# Patient Record
Sex: Female | Born: 1950 | Race: White | Hispanic: No | Marital: Married
Health system: Southern US, Community
[De-identification: ages and names within clinical notes are randomized; demographics above are authoritative.]

---

## 1997-10-21 ENCOUNTER — Other Ambulatory Visit: Admission: RE | Admit: 1997-10-21 | Discharge: 1997-10-21 | Payer: Self-pay | Admitting: Obstetrics and Gynecology

## 1997-10-30 ENCOUNTER — Ambulatory Visit (HOSPITAL_COMMUNITY): Admission: RE | Admit: 1997-10-30 | Discharge: 1997-10-30 | Payer: Self-pay | Admitting: Obstetrics and Gynecology

## 1998-11-05 ENCOUNTER — Encounter: Payer: Self-pay | Admitting: Obstetrics and Gynecology

## 1998-11-05 ENCOUNTER — Ambulatory Visit (HOSPITAL_COMMUNITY): Admission: RE | Admit: 1998-11-05 | Discharge: 1998-11-05 | Payer: Self-pay | Admitting: Obstetrics and Gynecology

## 1998-11-13 ENCOUNTER — Other Ambulatory Visit: Admission: RE | Admit: 1998-11-13 | Discharge: 1998-11-13 | Payer: Self-pay | Admitting: Obstetrics and Gynecology

## 1999-01-04 ENCOUNTER — Other Ambulatory Visit: Admission: RE | Admit: 1999-01-04 | Discharge: 1999-01-04 | Payer: Self-pay | Admitting: Obstetrics and Gynecology

## 1999-01-04 ENCOUNTER — Encounter (INDEPENDENT_AMBULATORY_CARE_PROVIDER_SITE_OTHER): Payer: Self-pay | Admitting: Specialist

## 1999-12-01 ENCOUNTER — Ambulatory Visit (HOSPITAL_COMMUNITY): Admission: RE | Admit: 1999-12-01 | Discharge: 1999-12-01 | Payer: Self-pay | Admitting: Obstetrics and Gynecology

## 1999-12-01 ENCOUNTER — Encounter: Payer: Self-pay | Admitting: Obstetrics and Gynecology

## 2000-02-24 ENCOUNTER — Other Ambulatory Visit: Admission: RE | Admit: 2000-02-24 | Discharge: 2000-02-24 | Payer: Self-pay | Admitting: Obstetrics and Gynecology

## 2000-10-26 ENCOUNTER — Encounter: Payer: Self-pay | Admitting: Obstetrics and Gynecology

## 2000-10-31 ENCOUNTER — Encounter (INDEPENDENT_AMBULATORY_CARE_PROVIDER_SITE_OTHER): Payer: Self-pay | Admitting: *Deleted

## 2000-10-31 ENCOUNTER — Inpatient Hospital Stay (HOSPITAL_COMMUNITY): Admission: RE | Admit: 2000-10-31 | Discharge: 2000-11-01 | Payer: Self-pay | Admitting: Obstetrics and Gynecology

## 2000-12-05 ENCOUNTER — Encounter: Payer: Self-pay | Admitting: Obstetrics and Gynecology

## 2000-12-05 ENCOUNTER — Ambulatory Visit (HOSPITAL_COMMUNITY): Admission: RE | Admit: 2000-12-05 | Discharge: 2000-12-05 | Payer: Self-pay | Admitting: Obstetrics and Gynecology

## 2001-12-06 ENCOUNTER — Encounter: Payer: Self-pay | Admitting: Family Medicine

## 2001-12-06 ENCOUNTER — Ambulatory Visit (HOSPITAL_COMMUNITY): Admission: RE | Admit: 2001-12-06 | Discharge: 2001-12-06 | Payer: Self-pay | Admitting: Obstetrics and Gynecology

## 2002-12-10 ENCOUNTER — Ambulatory Visit (HOSPITAL_COMMUNITY): Admission: RE | Admit: 2002-12-10 | Discharge: 2002-12-10 | Payer: Self-pay | Admitting: Family Medicine

## 2003-12-11 ENCOUNTER — Ambulatory Visit (HOSPITAL_COMMUNITY): Admission: RE | Admit: 2003-12-11 | Discharge: 2003-12-11 | Payer: Self-pay | Admitting: Family Medicine

## 2003-12-26 ENCOUNTER — Ambulatory Visit (HOSPITAL_COMMUNITY): Admission: RE | Admit: 2003-12-26 | Discharge: 2003-12-26 | Payer: Self-pay

## 2004-12-17 ENCOUNTER — Ambulatory Visit (HOSPITAL_COMMUNITY): Admission: RE | Admit: 2004-12-17 | Discharge: 2004-12-17 | Payer: Self-pay | Admitting: Family Medicine

## 2006-01-20 ENCOUNTER — Ambulatory Visit (HOSPITAL_COMMUNITY): Admission: RE | Admit: 2006-01-20 | Discharge: 2006-01-20 | Payer: Self-pay | Admitting: Family Medicine

## 2007-01-22 ENCOUNTER — Ambulatory Visit (HOSPITAL_COMMUNITY): Admission: RE | Admit: 2007-01-22 | Discharge: 2007-01-22 | Payer: Self-pay | Admitting: Family Medicine

## 2007-02-21 ENCOUNTER — Emergency Department (HOSPITAL_COMMUNITY): Admission: EM | Admit: 2007-02-21 | Discharge: 2007-02-21 | Payer: Self-pay | Admitting: Emergency Medicine

## 2007-03-18 ENCOUNTER — Encounter: Admission: RE | Admit: 2007-03-18 | Discharge: 2007-03-18 | Payer: Self-pay | Admitting: Neurology

## 2007-05-17 ENCOUNTER — Ambulatory Visit (HOSPITAL_COMMUNITY): Admission: RE | Admit: 2007-05-17 | Discharge: 2007-05-17 | Payer: Self-pay | Admitting: Cardiology

## 2007-11-27 ENCOUNTER — Emergency Department (HOSPITAL_COMMUNITY): Admission: EM | Admit: 2007-11-27 | Discharge: 2007-11-27 | Payer: Self-pay | Admitting: Emergency Medicine

## 2008-02-27 ENCOUNTER — Ambulatory Visit (HOSPITAL_COMMUNITY): Admission: RE | Admit: 2008-02-27 | Discharge: 2008-02-27 | Payer: Self-pay | Admitting: Family Medicine

## 2008-05-09 ENCOUNTER — Ambulatory Visit (HOSPITAL_COMMUNITY): Admission: RE | Admit: 2008-05-09 | Discharge: 2008-05-09 | Payer: Self-pay | Admitting: Family Medicine

## 2009-03-03 ENCOUNTER — Ambulatory Visit (HOSPITAL_COMMUNITY): Admission: RE | Admit: 2009-03-03 | Discharge: 2009-03-03 | Payer: Self-pay | Admitting: Family Medicine

## 2009-03-06 ENCOUNTER — Encounter: Admission: RE | Admit: 2009-03-06 | Discharge: 2009-03-06 | Payer: Self-pay | Admitting: Family Medicine

## 2009-04-21 ENCOUNTER — Emergency Department (HOSPITAL_COMMUNITY): Admission: EM | Admit: 2009-04-21 | Discharge: 2009-04-21 | Payer: Self-pay | Admitting: Emergency Medicine

## 2009-08-11 ENCOUNTER — Encounter: Admission: RE | Admit: 2009-08-11 | Discharge: 2009-08-11 | Payer: Self-pay | Admitting: Family Medicine

## 2010-02-28 ENCOUNTER — Encounter: Payer: Self-pay | Admitting: Neurology

## 2010-03-09 ENCOUNTER — Encounter
Admission: RE | Admit: 2010-03-09 | Discharge: 2010-03-09 | Payer: Self-pay | Source: Home / Self Care | Attending: Family Medicine | Admitting: Family Medicine

## 2010-05-02 LAB — CBC
HCT: 44.9 % (ref 36.0–46.0)
Hemoglobin: 15.3 g/dL — ABNORMAL HIGH (ref 12.0–15.0)
MCHC: 34.1 g/dL (ref 30.0–36.0)
MCV: 93.1 fL (ref 78.0–100.0)
Platelets: 154 10*3/uL (ref 150–400)
RBC: 4.82 MIL/uL (ref 3.87–5.11)
RDW: 13.3 % (ref 11.5–15.5)
WBC: 8.4 10*3/uL (ref 4.0–10.5)

## 2010-05-02 LAB — CK TOTAL AND CKMB (NOT AT ARMC)
CK, MB: 1.6 ng/mL (ref 0.3–4.0)
Relative Index: INVALID (ref 0.0–2.5)
Total CK: 85 U/L (ref 7–177)

## 2010-05-02 LAB — DIFFERENTIAL
Basophils Absolute: 0 10*3/uL (ref 0.0–0.1)
Basophils Relative: 0 % (ref 0–1)
Eosinophils Absolute: 0 10*3/uL (ref 0.0–0.7)
Eosinophils Relative: 0 % (ref 0–5)
Lymphocytes Relative: 14 % (ref 12–46)
Lymphs Abs: 1.2 10*3/uL (ref 0.7–4.0)
Monocytes Absolute: 0.2 10*3/uL (ref 0.1–1.0)
Monocytes Relative: 2 % — ABNORMAL LOW (ref 3–12)
Neutro Abs: 7 10*3/uL (ref 1.7–7.7)
Neutrophils Relative %: 83 % — ABNORMAL HIGH (ref 43–77)

## 2010-05-02 LAB — COMPREHENSIVE METABOLIC PANEL
ALT: 13 U/L (ref 0–35)
AST: 12 U/L (ref 0–37)
Albumin: 4.2 g/dL (ref 3.5–5.2)
Alkaline Phosphatase: 60 U/L (ref 39–117)
BUN: 10 mg/dL (ref 6–23)
CO2: 27 mEq/L (ref 19–32)
Calcium: 9.5 mg/dL (ref 8.4–10.5)
Chloride: 107 mEq/L (ref 96–112)
Creatinine, Ser: 0.73 mg/dL (ref 0.4–1.2)
GFR calc Af Amer: >60 mL/min (ref >60)
GFR calc non Af Amer: >60 mL/min (ref >60)
Glucose, Bld: 101 mg/dL — ABNORMAL HIGH (ref 70–99)
Potassium: 3.9 mEq/L (ref 3.5–5.1)
Sodium: 142 mEq/L (ref 135–145)
Total Bilirubin: 0.5 mg/dL (ref 0.3–1.2)
Total Protein: 6.9 g/dL (ref 6.0–8.3)

## 2010-05-02 LAB — URINALYSIS, ROUTINE W REFLEX MICROSCOPIC
Bilirubin Urine: NEGATIVE
Glucose, UA: NEGATIVE mg/dL
Hgb urine dipstick: NEGATIVE
Ketones, ur: NEGATIVE mg/dL
Nitrite: NEGATIVE
Protein, ur: NEGATIVE mg/dL
Specific Gravity, Urine: 1.004 — ABNORMAL LOW (ref 1.005–1.030)
Urobilinogen, UA: 0.2 mg/dL (ref 0.0–1.0)
pH: 7 (ref 5.0–8.0)

## 2010-05-02 LAB — TROPONIN I: Troponin I: <0.01 ng/mL (ref 0.00–0.06)

## 2010-05-02 LAB — SEDIMENTATION RATE: Sed Rate: 1 mm/hr (ref 0–22)

## 2010-06-22 NOTE — Op Note (Signed)
NAME:  Katelyn Martin, Katelyn Martin                  ACCOUNT NO.:  [id number]   MEDICAL RECORD NO.:  [id number]          PATIENT TYPE:  AMB   LOCATION:  ENDO                         FACILITY:  [facility]   PHYSICIAN:  Darlin Priestly, MD  DATE OF BIRTH:  Jul 20, 1950   DATE OF PROCEDURE:  DATE OF DISCHARGE:                               OPERATIVE REPORT   PROCEDURE:  Transesophageal echocardiogram.   COMPLICATIONS:  None.   INDICATIONS:  Ms. Virgo is a 60 year old female, the patient of Dr.  Avie Echevaria, and Dr. Oley Balm with a positive family history of  CVA, who developed a right body numbness on 03/15/2007, which lasted  approximately 15 minutes.  She subsequently had a head CT without  contrast, which was normal and was referred for neurologic evaluation.  She did have an MRI, which was suggestive of small vessel ischemia with  a normal MRA, carotid Dopplers, and ECG.  She is now referred for TE to  rule out possible cardiac source of illness.   DESCRIPTION OF PROCEDURE:  After informed consent, the patient was  brought to the endoscopy suite in a fasting state.  She underwent a  successful and uncomplicated transesophageal echocardiogram.   The left ventricle was normal in size and function with an estimated EF  of 60%.   The aortic valve was structurally normal with no significant aortic  stenosis or regurgitation.  The mitral valve leaflet was structurally  normal with trivial mitral regurg.   Structurally, normal tricuspid valve with trivial tricuspid  regurgitation.   No evidence of PFO by color or contrast Doppler.  No evidence of  endocardiac mass or thrombus.   Normal descending thoracic aorta.   CONCLUSIONS:  Essentially normal transesophageal echocardiogram with no  evidence of endocardiac mass, thrombus, or PFO.      Darlin Priestly, MD  Electronically Signed     RHM/MEDQ  D:  05/17/2007  T:  05/18/2007  Job:  [id number]   cc:   Genene Churn. Love, M.D.  D. Oley Balm III, M.D.

## 2010-06-22 NOTE — H&P (Signed)
NAME:  JOSI, ROEDIGER NO.:  [id number]   MEDICAL RECORD NO.:  [id number]          PATIENT TYPE:  AMB   LOCATION:  ENDO                         FACILITY:  [facility]   PHYSICIAN:  Darlin Priestly, MD  DATE OF BIRTH:  May 29, 1950   DATE OF ADMISSION:  05/17/2007  DATE OF DISCHARGE:                              HISTORY & PHYSICAL   HISTORY OF PRESENT ILLNESS:  Ms. Scaff is a 60 year old female who was  referred to Dr. Jenne Campus for TEE by Dr. Deanne Coffer at [facility].  She had been seen initially by Dr. Sandria Manly on March 15, 2007 after  transient right facial numbness. She had a negative workup so far.  She  is seen now for elective TEE.   PAST MEDICAL HISTORY:  Unremarkable for coronary artery disease, or  hypertension or diabetes.   CURRENT MEDICATIONS:  1. Premarin 0.5 mg a day.  2. Lexapro 10 mg 1/2 tablet b.i.d.  3. Aspirin 81 mg a day.   ALLERGIES:  She has not had any drug allergies.   SOCIAL HISTORY:  She is a Retail banker and works full times.  She is  married.  She smokes a couple cigarettes a day.   FAMILY HISTORY:  Unremarkable.   REVIEW OF SYSTEMS:  Essentially unremarkable except for above.  She has  not had tachycardia, palpitations, or syncope.   PHYSICAL EXAMINATION:  VITAL SIGNS:  Blood pressure 136/72, pulse 55,  respirations 12.  GENERAL:  She is a well-developed, well-nourished thin female in no  acute distress.  HEENT:  Normocephalic, atraumatic.  Extraocular ocular movements are  intact.  Sclerae is anicteric.  NECK:  Without JVD or bruit.  CHEST:  Clear to auscultation percussion.  CARDIAC:  Regular rate and rhythm without murmur, rub or gallop.  ABDOMEN:  Nontender.  No hepatosplenomegaly.  EXTREMITIES:  Without edema.  Distal pulses are intact.  NEUROLOGICAL:  Grossly intact.  She is awake, alert and oriented,  operative.  Moves all extremities without obvious deficit.   IMPRESSION:  1. Transient ischemic attack.  2. History of  smoking.   PLAN:  The patient is admitted for elective TEE by Dr. Jenne Campus.  Should  note the patient has no history of esophageal stricture or GI disease.      Abelino Derrick, P.A.      Darlin Priestly, MD  Electronically Signed    LKK/MEDQ  D:  05/17/2007  T:  05/18/2007  Job:  [id number]

## 2010-06-25 NOTE — Op Note (Signed)
[facility]  Patient:    Katelyn Martin, Katelyn Martin Visit Number: [id number] MRN: [id number]          Service Type: GYN Location: 1S X004 01 Attending Physician:  Jenean Lindau Dictated by:   Laqueta Linden, M.D. Proc. Date: 10/31/00 Admit Date:  10/31/2000                             Operative Report  PREOPERATIVE DIAGNOSIS:  Leiomyomata uteri.  POSTOPERATIVE DIAGNOSIS:  Leiomyomata uteri plus ovarian endometriosis.  PROCEDURE:  Total abdominal hysterectomy with left salpingo-oophorectomy.  SURGEON:  Laqueta Linden, M.D.  ASSISTANT:  Andres Ege, M.D.  ANESTHESIA:  General endotracheal.  ESTIMATED BLOOD LOSS:  500 cc.  URINE OUTPUT:  450 cc.  FLUIDS:  1300 cc of crystalloid.  COUNTS:  Correct x 2.  COMPLICATIONS:  None.  INDICATIONS:  The patient is a 60 year old gravida 3, para 0 female with a large fibroid uterus which had been noted to be gradually increasing in size over the past several years on ultrasound.  Her main symptoms were menorrhagia and some pressure symptoms, although she was not terribly aware beyond having some urinary frequency.  Alternatives including awaiting menopause with anticipated shrinkage of the fibroids, which was not felt likely to shrink the fibroids to a sufficient degree to avoid surgery, versus definitive surgical management was discussed.  The patient elected to proceed to hysterectomy. She was offered other alternatives including endometrial ablation, embolization, resection, or hormonal management to get her to menopause and reassess her uterus size and symptoms.  She did desire ovarian conservation unless abnormalities of the ovaries were encountered.  She is aware that she may have an increased risks of ovarian cancer due to her nulliparous state, but desires natural rather than surgical menopause if possible.  She has seen the informed consent film as well as her understanding of all risks,  benefits, alternatives, and complications including, but not limited to anesthesia risks, infection, bleeding possibly requiring transfusion, injury to bowel, bladder, ureters, vessels, and nerves, possibility of fistula formation, postoperative expectations regarding recovery, sexual functioning, immediate versus eventual menopause, and the likely need for ______ replacement therapy, a well as other surgical risks including DVT, PE, pneumonia, death, and other unnamed risks.  She has voiced her understanding of all the risks, and agrees to proceed.  Full consent was given.  She has received Ancef 1 g IV antibiotic prophylaxis preoperatively.  DESCRIPTION OF PROCEDURE:  The patient was taken to the operating room and after proper identification and consents were ascertained, she was placed on the operating table in the supine position.  After the induction of general endotracheal anesthesia, she was placed in the frog-leg position, and the abdomen, perineum, and vagina were prepped and draped in the routine sterile fashion.  A transurethral Foley was placed.  The patient was then returned to the supine position, at which time, a low Pfannenstiel incision was made, and carried down to the level of the anterior rectus fascia.  The fascia was incised and the incision was extended laterally, superiorly, and inferiorly. The rectus muscles were separated and the parietal peritoneum elevated and incised.  The incision was extended superiorly and inferiorly to the level of the bladder.  Palpation of the upper abdomen revealed smooth renal contours bilaterally.  No obvious gallstones.  The appendix was noted to be quite long with a sludge and a small stone noted in the tip  which was gently milked into the lumen of the cecum.  The appendix was otherwise free of adhesions or evidence of endometriosis.  The self-retaining retractor was placed and the bowel packed in the upper abdomen using moistened  lap packs.  Inspection of the pelvis revealed an enlarged, grossly distorted uterus with a large fundal and right-sided serosal fibroids _________ mobility.  The right ovary was definitely adherent to the posterior aspect of the uterus and when eventually freed up, had diffuse scarring consistent with endometriosis.  Both tubes appeared normal.  The left ovary was small and menopausal in appearance, but was otherwise within normal limits.  The uterus was elevated in the operative plane.  The round ligaments were clamped, cut, and suture ligated with 0 Vicryl suture, and tagged.  The broad ligament was then opened anteriorly and posteriorly on both sides.  It was elected to remove the left ovary due to the scarring and endometriotic changes as per previous discussion with the patient.  The infundibulopelvic ligament was isolated, clamped, and cut after ascertaining the ureter was well out of the operative field.  It was doubly ligated with 0 Vicryl.  The right adnexa were retained and a curved Heaney clamp was placed across the proximal utero-ovarian ligament and fallopian tube.  This pedicle was doubly ligated and then suspended from the round ligament on that side.  The uterus was then elevated and further dissection of the bladder off the lower uterine segment and cervix was then performed using sharp and blunt dissection.  The uterine vessels were skeletonized and curved Heaney clamps placed perpendicularly across the vessels at the level of the internal os.  Pedicles were cut and suture ligated with 3-0 Vicryl.  A straight Heaney clamp was placed across an additional small pedicle of tissue with the pedicle cut and ligated such that the suture incorporated the uterine vessels such that they were doubly ligated.  Successive straight Heaney clamps were placed across the cardinal ligaments bilaterally to the level of the upper vaginal angle.  Curved Heaney clamps were placed across the  upper vaginal angles with the uterosacral ligament. The upper vagina was entered and the cervix was circumscribed with the  Satinsky scissors and the specimen removed.  The uterus, left tube and ovary were sent in one piece to pathology.  The angles were closed with Richardson angled sutures of 0 Vicryl.  The remainder of the vaginal cuff was closed from front to back using interrupted figure-of-eight sutures of 0 Vicryl.  Copious lavage was accomplished and several small bleeding points were cauterized.  The uterosacral ligaments were plicated posteriorly.  The ureters were normal in appearance and location and peristalsing normally bilaterally.  Hemostasis was noted to be excellent in all pedicles and peritoneal surfaces. All instruments and lap packs were removed.  Needle, sponge, and instrument counts were correct prior to closing the abdomen.  Parietal peritoneum was closed in a running fashion using 2-0 Vicryl suture.  The rectus muscles were loosely reapproximated in the midline.  After subfascial hemostasis was ascertained, the fascia was closed from both lateral aspects to the midline using a running stitch of 0 Maxon. Subcutaneous hemostasis was ascertained.  The skin was closed with staples and Steri-Strips after a pressure dressing was applied.  The patient was extubated stable and transferred to the recovery room. Estimated blood loss was 100 cc.  Urine output was 450 cc.  Fluids 1300 cc of crystalloids.  Counts correct x 2.  Complications none. Dictated by:  Laqueta Linden, M.D. Attending Physician:  Jenean Lindau DD:  10/31/00 TD:  10/31/00 Job: [id number]

## 2010-06-25 NOTE — H&P (Signed)
[facility]  Patient:    Katelyn Martin, Katelyn Martin Visit Number: [id number] MRN: [id number]          Service Type: GYN Location: 1S X004 01 Attending Physician:  Jenean Lindau Dictated by:   Laqueta Linden, M.D. Admit Date:  10/31/2000                           History and Physical  IDENTIFYING DATA:  The patient is a 60 year old female admitted for TAH and possible BSO for symptomatic enlarging fibroids.  HISTORY OF PRESENT ILLNESS:  The patient is a 60 year old gravida 3, para 0, married female who referred herself for another evaluation due to large fibroids and a previous recommendation of hysterectomy.  She was noted to have a 16- to 18-week-size uterus.  Her main complaint was menorrhagia and change in frequency of menses consistent with her age.  She had had multiple followup ultrasounds and an endometrial biopsy performed in the preceding one to two years which had been normal.  At that time, a long discussion was held regarding the anticipated menopausal shrinkage of the fibroids, which were not felt to be significant enough to make them not be a problem.  Alternatives including endometrial ablation, embolization, myomectomy and fibroid drilling were also discussed with her at length.  She elected to proceed to definitive surgical management.  She desired ovarian conservation if they appeared normal but understood that unilateral or bilateral salpingo-oophorectomy would be performed in the event of any abnormality encountered.  She understands that she will be naturally menopausal in the relative near future versus surgically menopausal if her ovaries are removed at the time of surgery.  She presents now for definitive surgical management.  Full consent is given.  PAST HISTORY: Medical:  Negative.  Surgical:  D&C x 3.  Obstetrical:  Nulliparous.  Gynecologic:  History of three first trimester pregnancy terminations in the distant past.   Husband has had a vasectomy.  Last Pap smear in January of 2002 within normal limits.  Mammogram, October of 2001, within normal limits.  ALLERGIES:  No known drug allergies or Latex sensitivities.   CURRENT MEDICATIONS: 1. Womens Only one daily vitamin. 2. Tums two twice daily.  TRANSFUSION HISTORY:  Negative.  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  Her husband is a Occupational hygienist for [employer]. She smokes approximately one-half pack per day.  She denies any alcohol abuse or illicit drug use.  She exercises regularly.  FAMILY HISTORY:  Positive for father with hypertension with a CVA at the age of 2 and lung cancer.  Mother with a stroke due to an AVM with associated seizures.  Brother with multiple CVAs in a vegetative state at the age of 58 with a negative clotting workup and unclear etiology of the CVAs.  Family history otherwise noncontributory.  REVIEW OF SYSTEMS:  Notable for the history of present illness.  She reports some vague pressure symptoms and urinary frequency but no significant back pain, bowel changes or bladder problems.  CARDIOVASCULAR/RESPIRATORY: Negative.  GI:  Negative.  NEUROLOGIC:  Negative.  PHYSICAL EXAMINATION:  GENERAL:  Physical exam performed prior to admission revealed a healthy, thin white female in no apparent distress.  Height 5 feet 6-1/2 inches.  Weight 129 pounds.  VITAL SIGNS:  Blood pressure 124/70.  HEENT:  Grossly negative.  Oropharynx clear.  NECK:  Supple without thyromegaly or lymphadenopathy.  CHEST:  Without spine or CVA tenderness.  LUNGS:  Clear to auscultation.  HEART:  Regular rate and rhythm without murmurs, gallops or rubs.  Carotids +2 and equal without bruit.  Distal pulses full.  BREASTS:  Without masses.  ABDOMEN:  Without hepatosplenomegaly.  Firm suprapubic mass palpated to midway between the symphysis and the umbilicus.  PELVIC:  Normal  female external genitalia, clean vagina and cervix.  Uterus 16 to 18 weeks size and grossly irregular.  Adnexa not palpated. Rectovaginal confirmatory.  EXTREMITIES:  Without edema.  NEUROLOGIC:  Grossly nonfocal.  LABORATORY STUDIES:  Laboratory studies on admission reveal a hemoglobin of 16.9, hematocrit of 48.7, normal WBC and platelet function, normal metabolic profile, normal coagulation studies.  Urinalysis with less than 3 rbcs and rare epithelial cells.  Chest x-ray with mild chronic bronchitis, otherwise normal.  EKG not performed per anesthesia.  IMPRESSIONS ON ADMISSION: 1. Perimenopausal female with large symptomatic fibroids. 2. Smoker. 3. Strong family history of cerebrovascular accidents.  PLAN:  The patient is admitted for same-day surgery.  She will undergo a TAH with BSO only if felt indicated at the time of surgery.  She has been extensively counseled as to the risks, benefits, alternatives and complications and agrees to proceed.  Full consent has been given.  She has been n.p.o. since midnight and received 1 g of IV antibiotics prophylaxis. Dictated by:   Laqueta Linden, M.D. Attending Physician:  Jenean Lindau DD:  10/31/00 TD:  10/31/00 Job: [id number]

## 2010-10-28 LAB — I-STAT 8, (EC8 V) (CONVERTED LAB)
Acid-Base Excess: 2
BUN: 22
Bicarbonate: 29.6 — ABNORMAL HIGH
Chloride: 107
Glucose, Bld: 107 — ABNORMAL HIGH
HCT: 52 — ABNORMAL HIGH
Hemoglobin: 17.7 — ABNORMAL HIGH
Operator id: 270651
Potassium: 4.3
Sodium: 140
TCO2: 31
pCO2, Ven: 56.9 — ABNORMAL HIGH
pH, Ven: 7.325 — ABNORMAL HIGH

## 2010-10-28 LAB — POCT I-STAT CREATININE
Creatinine, Ser: 0.9
Operator id: 270651

## 2010-11-02 LAB — CBC
HCT: 47 — ABNORMAL HIGH
Platelets: 146 — ABNORMAL LOW
RDW: 13.6

## 2010-11-02 LAB — BASIC METABOLIC PANEL
BUN: 15
GFR calc non Af Amer: >60
Glucose, Bld: 86
Potassium: 4.5

## 2010-11-02 LAB — APTT: aPTT: 29

## 2010-11-08 LAB — CBC
HCT: 44.6
Hemoglobin: 14.7
MCHC: 33
RDW: 13.2

## 2010-11-08 LAB — DIFFERENTIAL
Basophils Absolute: 0
Basophils Relative: 1
Eosinophils Absolute: 0.1
Eosinophils Relative: 1
Monocytes Absolute: 0.2
Neutro Abs: 8.1 — ABNORMAL HIGH

## 2010-11-08 LAB — POCT I-STAT, CHEM 8
Calcium, Ion: 1.11 — ABNORMAL LOW
HCT: 46
TCO2: 27

## 2011-03-09 ENCOUNTER — Other Ambulatory Visit (HOSPITAL_COMMUNITY): Payer: Self-pay | Admitting: Family Medicine

## 2011-03-09 DIAGNOSIS — Z1231 Encounter for screening mammogram for malignant neoplasm of breast: Secondary | ICD-10-CM

## 2011-04-13 ENCOUNTER — Ambulatory Visit (HOSPITAL_COMMUNITY): Payer: Self-pay

## 2011-05-23 ENCOUNTER — Ambulatory Visit (HOSPITAL_COMMUNITY)
Admission: RE | Admit: 2011-05-23 | Discharge: 2011-05-23 | Disposition: A | Discharge status: Home / Self Care | Payer: BC Managed Care – PPO | Source: Ambulatory Visit | Attending: Family Medicine | Admitting: Family Medicine

## 2011-05-23 DIAGNOSIS — Z1231 Encounter for screening mammogram for malignant neoplasm of breast: Secondary | ICD-10-CM | POA: Insufficient documentation

## 2011-06-15 IMAGING — CR DG CHEST 2V
2 series · 2 of 2 positions shown · non-contrast
Comparison: 05/17/2007

CLINICAL DATA: Dizziness.  Headache.

CHEST - 2 VIEW

[view not recorded (1 of 2)]
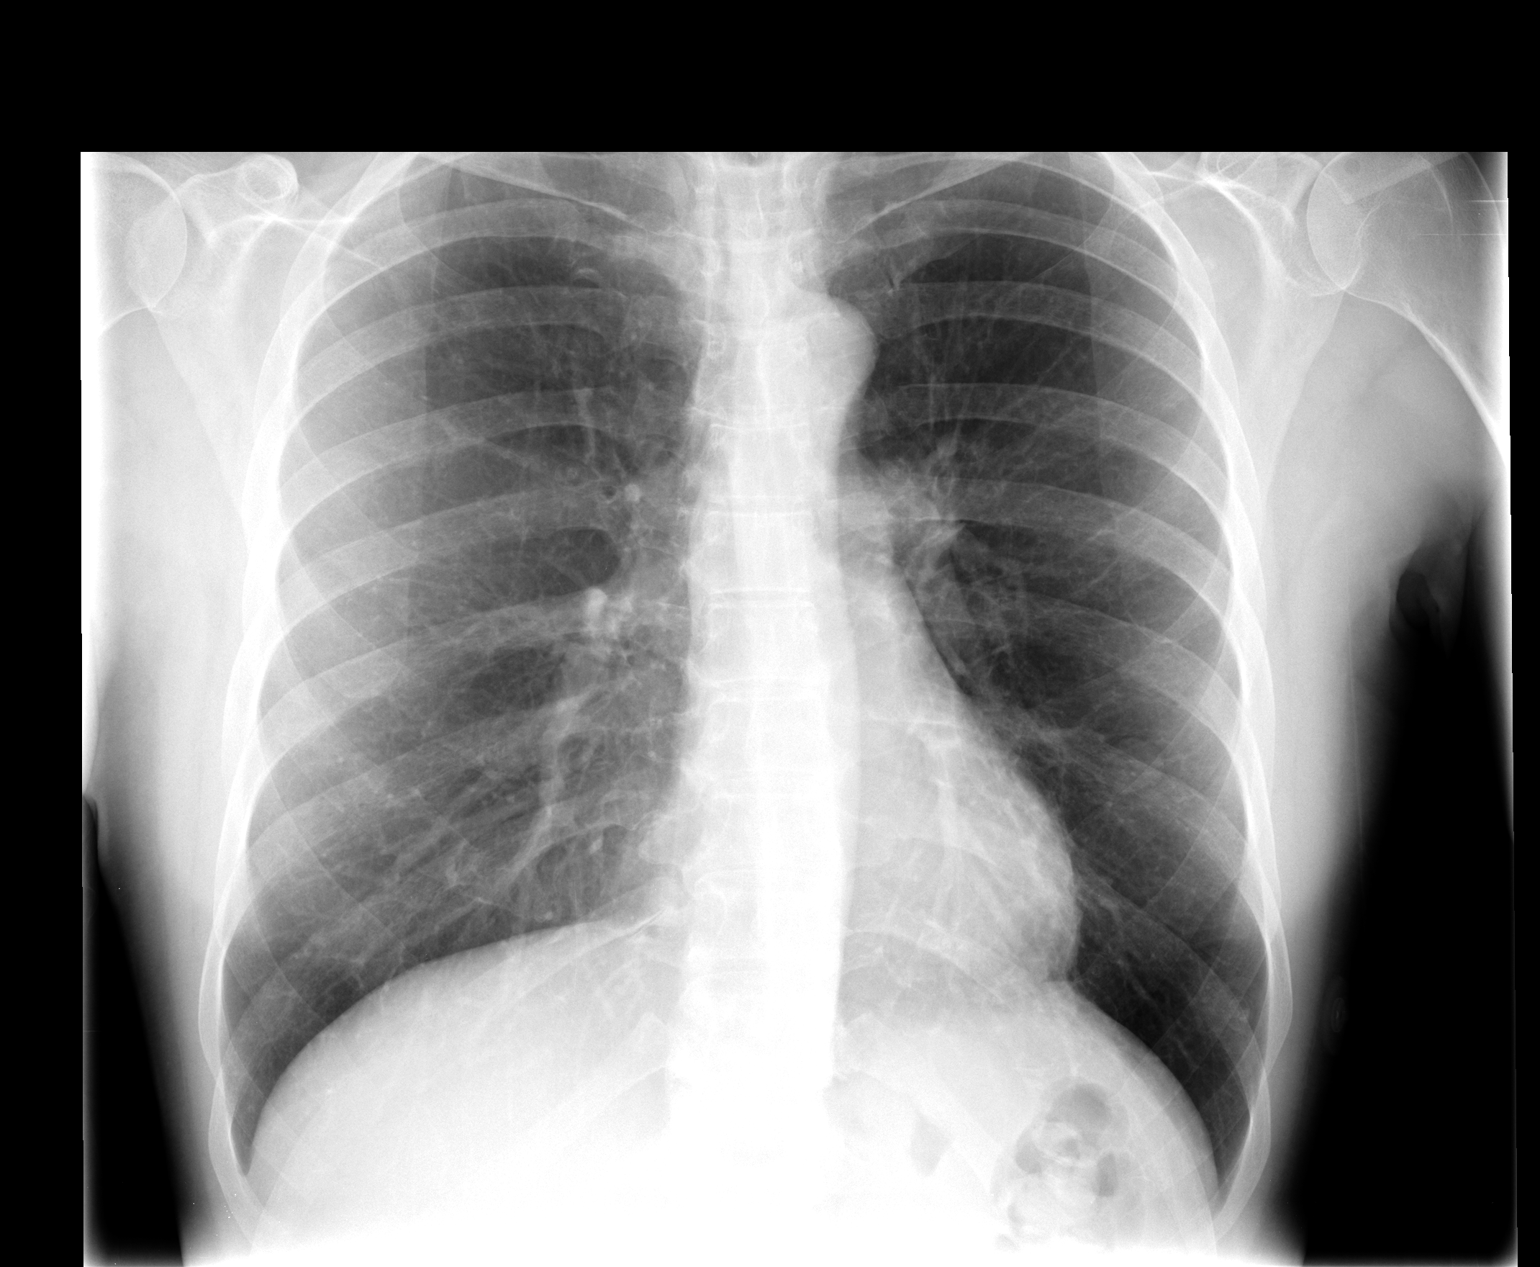

[view not recorded (2 of 2)]
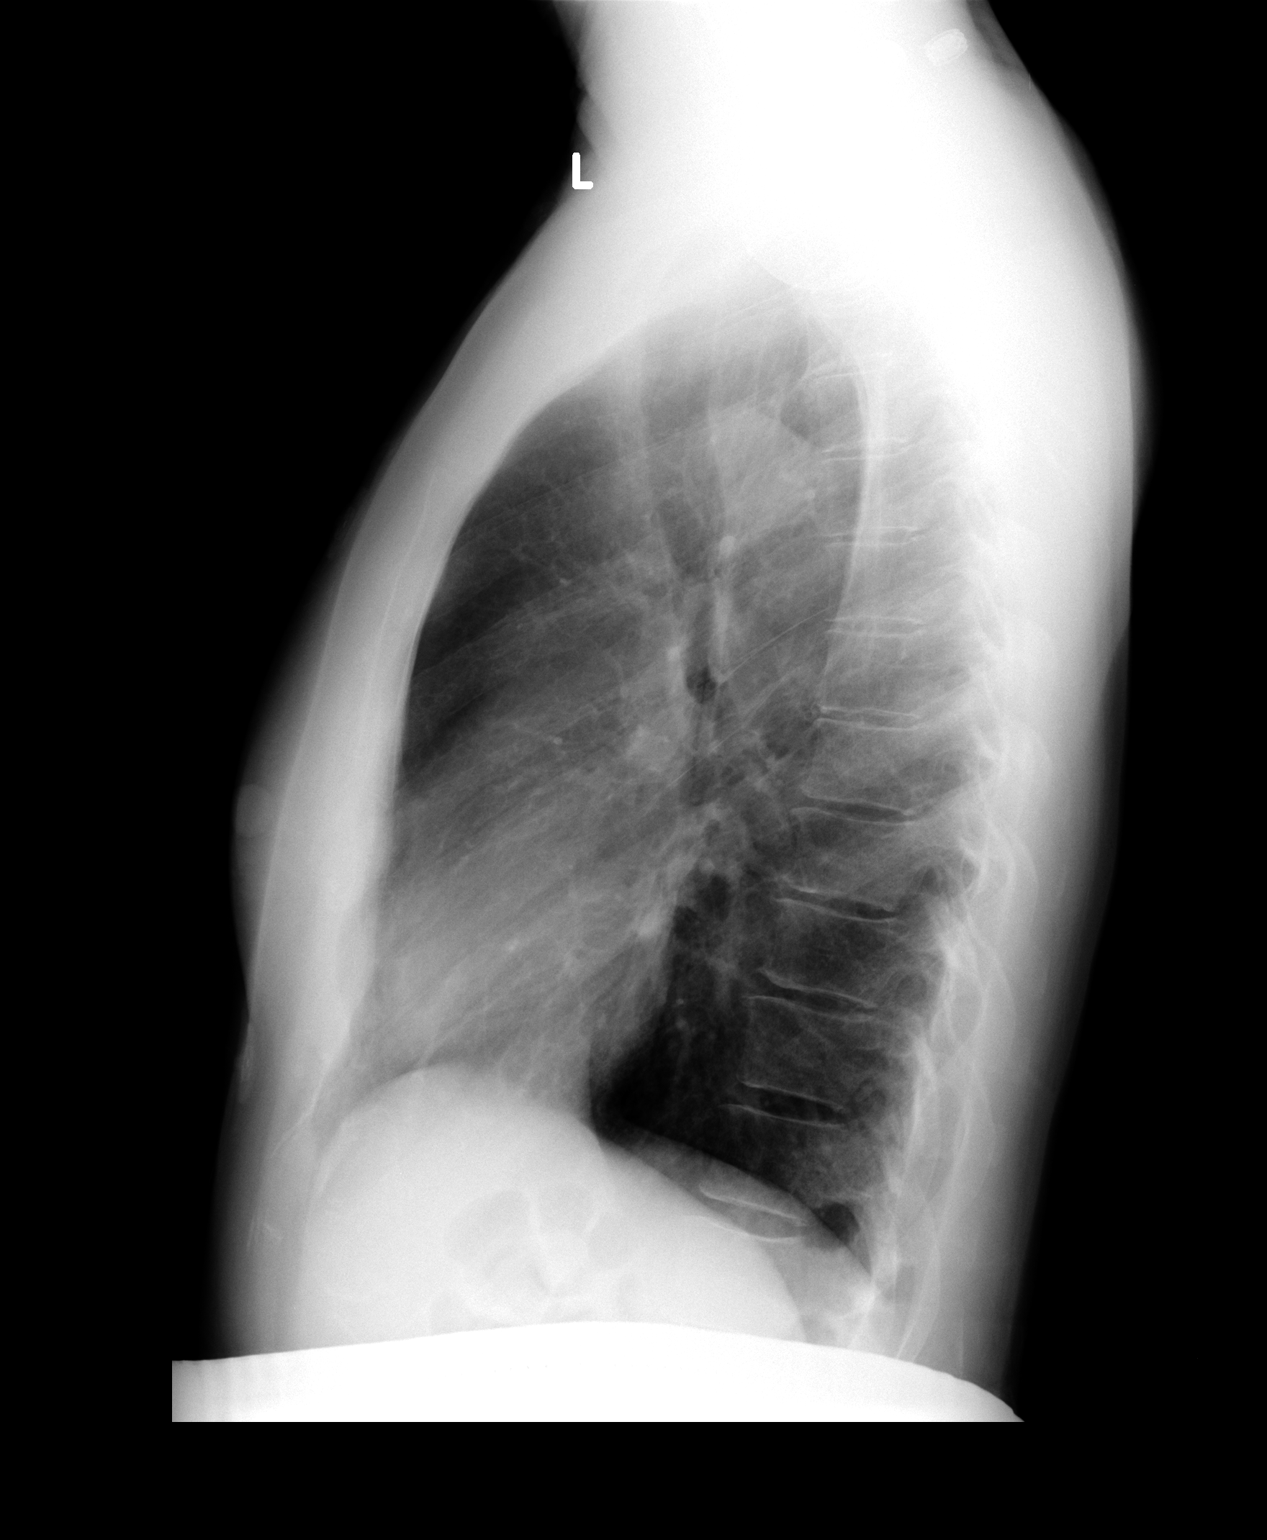

[2 of 2 positions shown; findings below may reference images not displayed]

FINDINGS: The cardiac silhouette, mediastinal and hilar contours
are within normal limits and stable.  The lungs demonstrate mild
hyperinflation.  No acute pulmonary findings.  Bilateral nipple
shadows are noted. The bony thorax is intact.
IMPRESSION: No acute cardiopulmonary findings.  Mild hyperinflation.

## 2011-06-15 IMAGING — CT CT HEAD W/O CM
1 of 2 series · 13 of 30 positions shown, 17 images · non-contrast
Comparison: 11/27/2007 study

CLINICAL DATA: History of visual problems with speckles in
peripheral vision.  Headache.

CT HEAD WITHOUT CONTRAST
TECHNIQUE: Contiguous axial images were obtained from the base of
the skull through the vertex without contrast

[Series 2: brain · axial · 0.47mm/px · z∈[+121,+254]mm · 13 of 32 slices shown, 17 images]
[im 3/32  brain]
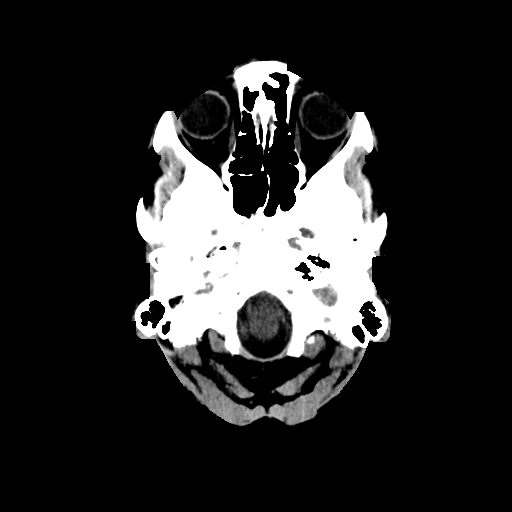
[im 3/32  bone]
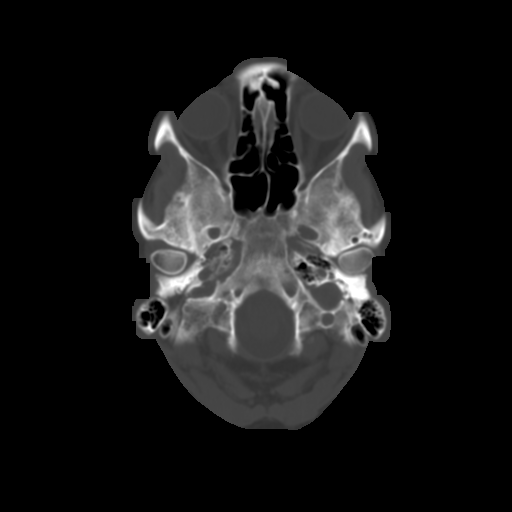
[im 5/32  brain]
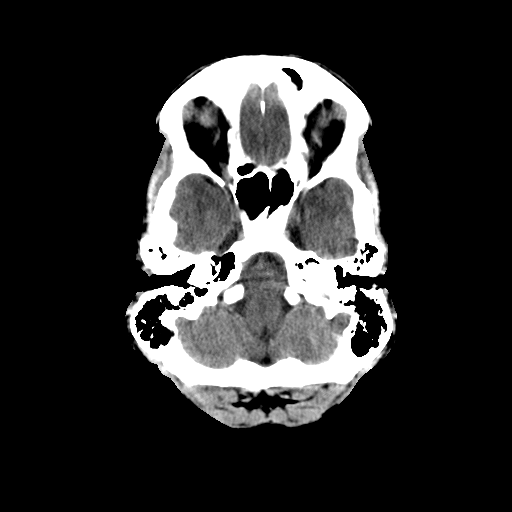
[im 7/32  brain]
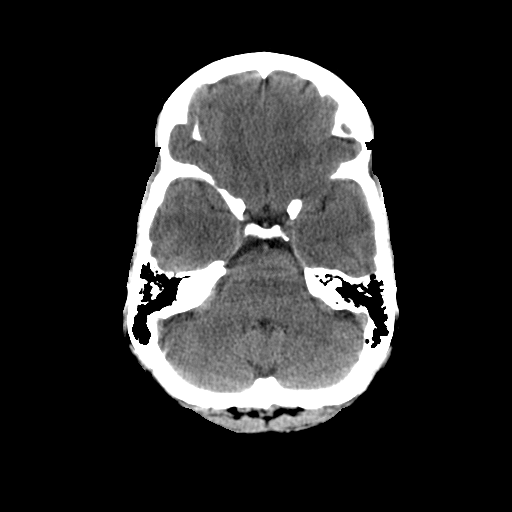
[im 9/32  brain]
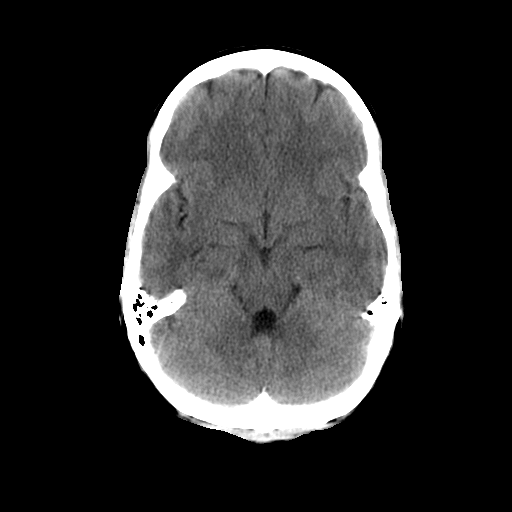
[im 12/32  brain]
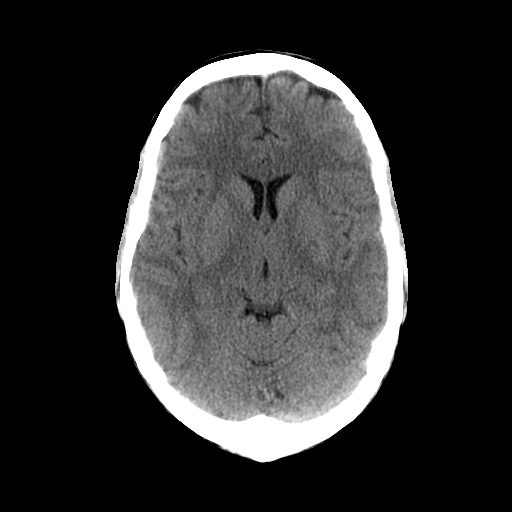
[im 12/32  bone]
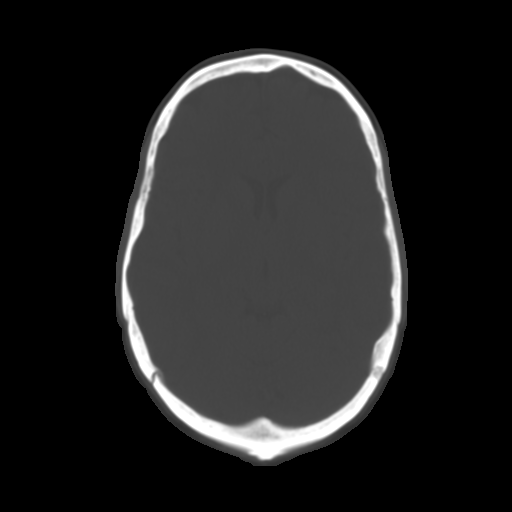
[im 14/32  brain]
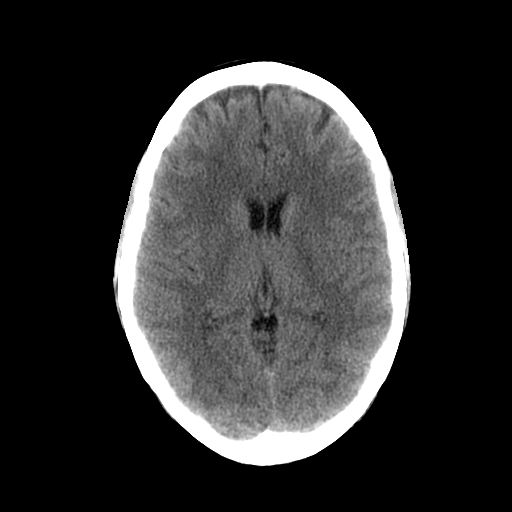
[im 16/32  brain]
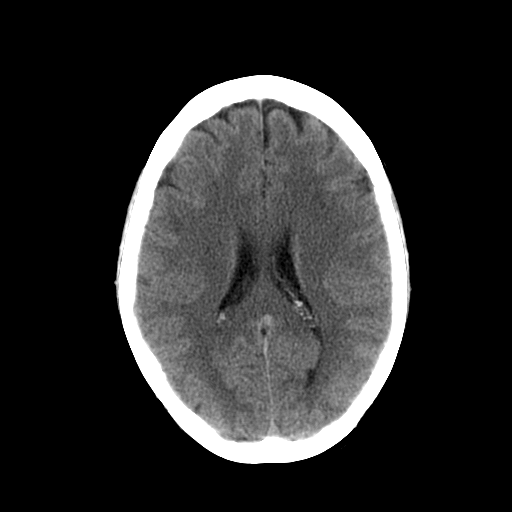
[im 18/32  brain]
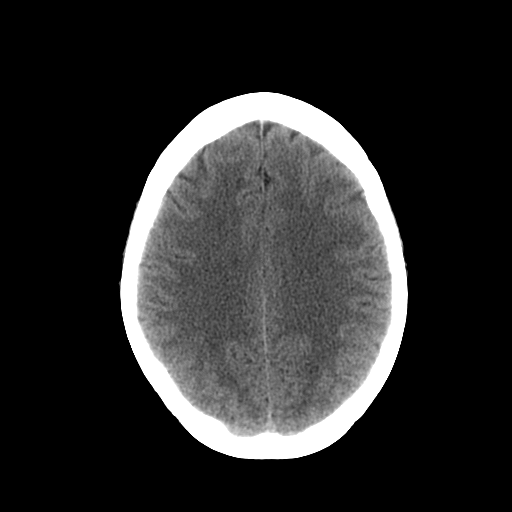
[im 20/32  brain]
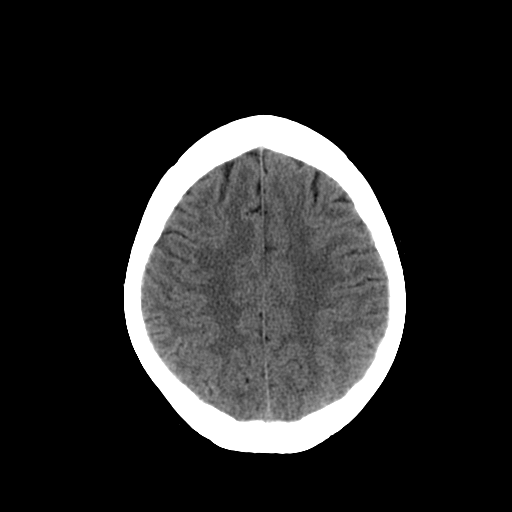
[im 20/32  bone]
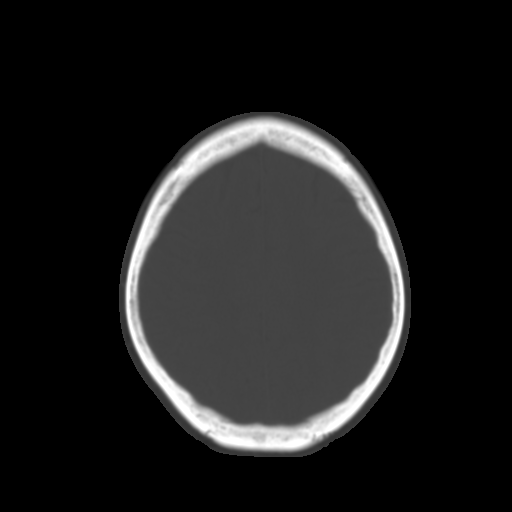
[im 23/32  brain]
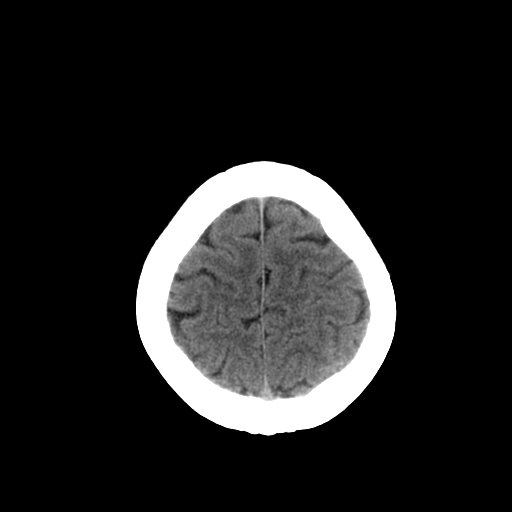
[im 25/32  brain]
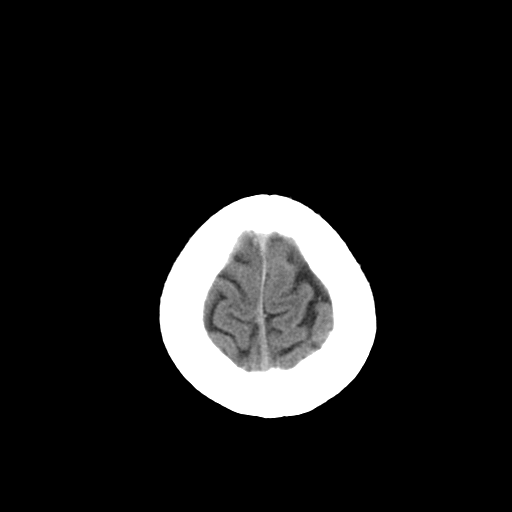
[im 27/32  brain]
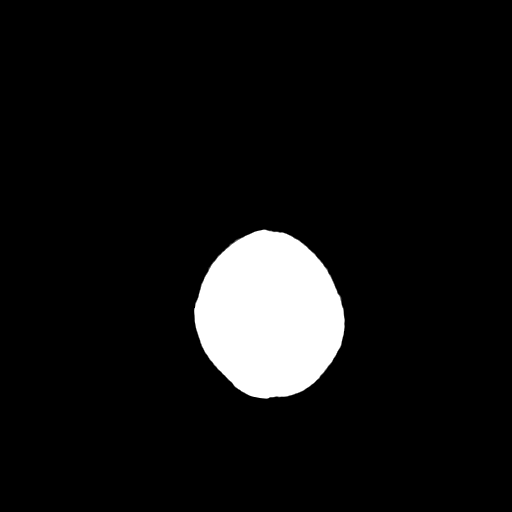
[im 29/32  brain]
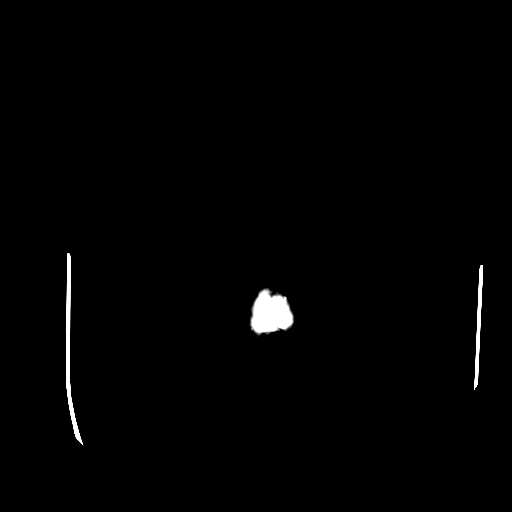
[im 29/32  bone]
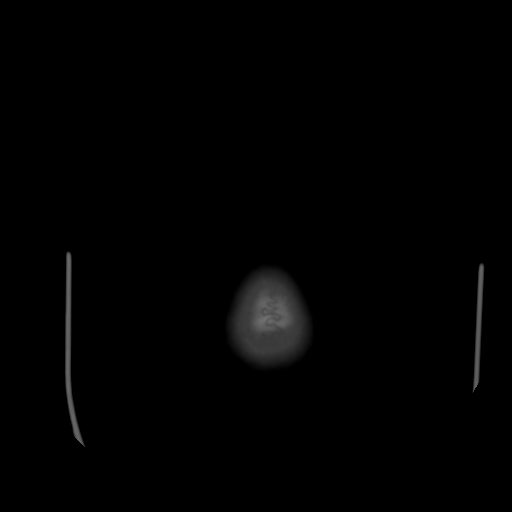

[13 of 30 positions shown; findings below may reference images not displayed]

FINDINGS: There is no evidence of brain mass, brain hemorrhage, or
acute infarction.

The ventricular system is normal size and shape.  There is no
evidence of shift of midline structures, parenchymal lesion, or
subdural or epidural hematoma.

The calvarium is intact.  Mastoids are well aerated.  No sinusitis
is evident. Density is seen in each external ear canal.  Presumably
this represents cerumen.  Recommend correlation with physical
examination.
IMPRESSION: There is no evidence of brain mass, brain hemorrhage, or acute
infarction.

No acute or active process is seen.  No skull lesion is evident.
No sinusitis is evident.

## 2012-06-11 ENCOUNTER — Other Ambulatory Visit (HOSPITAL_COMMUNITY): Payer: Self-pay | Admitting: Family Medicine

## 2012-06-11 DIAGNOSIS — Z1231 Encounter for screening mammogram for malignant neoplasm of breast: Secondary | ICD-10-CM

## 2012-06-22 ENCOUNTER — Ambulatory Visit (HOSPITAL_COMMUNITY)
Admission: RE | Admit: 2012-06-22 | Discharge: 2012-06-22 | Disposition: A | Discharge status: Home / Self Care | Payer: BC Managed Care – PPO | Source: Ambulatory Visit | Attending: Family Medicine | Admitting: Family Medicine

## 2012-06-22 DIAGNOSIS — Z1231 Encounter for screening mammogram for malignant neoplasm of breast: Secondary | ICD-10-CM | POA: Insufficient documentation

## 2013-06-24 ENCOUNTER — Other Ambulatory Visit (HOSPITAL_COMMUNITY): Payer: Self-pay | Admitting: Family Medicine

## 2013-06-24 DIAGNOSIS — Z1231 Encounter for screening mammogram for malignant neoplasm of breast: Secondary | ICD-10-CM

## 2013-07-11 ENCOUNTER — Ambulatory Visit (HOSPITAL_COMMUNITY): Payer: BC Managed Care – PPO

## 2013-07-18 ENCOUNTER — Ambulatory Visit (HOSPITAL_COMMUNITY)
Admission: RE | Admit: 2013-07-18 | Discharge: 2013-07-18 | Disposition: A | Discharge status: Home / Self Care | Payer: BC Managed Care – PPO | Source: Ambulatory Visit | Attending: Family Medicine | Admitting: Family Medicine

## 2013-07-18 DIAGNOSIS — Z1231 Encounter for screening mammogram for malignant neoplasm of breast: Secondary | ICD-10-CM | POA: Insufficient documentation

## 2014-10-07 ENCOUNTER — Other Ambulatory Visit (HOSPITAL_COMMUNITY): Payer: Self-pay | Admitting: Family Medicine

## 2014-10-07 DIAGNOSIS — Z1231 Encounter for screening mammogram for malignant neoplasm of breast: Secondary | ICD-10-CM

## 2014-10-14 ENCOUNTER — Ambulatory Visit (HOSPITAL_COMMUNITY): Payer: Self-pay

## 2014-10-22 ENCOUNTER — Ambulatory Visit (HOSPITAL_COMMUNITY)
Admission: RE | Admit: 2014-10-22 | Discharge: 2014-10-22 | Disposition: A | Discharge status: Home / Self Care | Payer: BLUE CROSS/BLUE SHIELD | Source: Ambulatory Visit | Attending: Family Medicine | Admitting: Family Medicine

## 2014-10-22 DIAGNOSIS — Z1231 Encounter for screening mammogram for malignant neoplasm of breast: Secondary | ICD-10-CM | POA: Diagnosis not present

## 2014-10-22 DIAGNOSIS — R921 Mammographic calcification found on diagnostic imaging of breast: Secondary | ICD-10-CM | POA: Diagnosis not present

## 2014-10-23 ENCOUNTER — Other Ambulatory Visit: Payer: Self-pay | Admitting: Family Medicine

## 2014-10-23 DIAGNOSIS — R928 Other abnormal and inconclusive findings on diagnostic imaging of breast: Secondary | ICD-10-CM

## 2014-11-04 ENCOUNTER — Ambulatory Visit
Admission: RE | Admit: 2014-11-04 | Discharge: 2014-11-04 | Disposition: A | Discharge status: Home / Self Care | Payer: BLUE CROSS/BLUE SHIELD | Source: Ambulatory Visit | Attending: Family Medicine | Admitting: Family Medicine

## 2014-11-04 ENCOUNTER — Other Ambulatory Visit: Payer: Self-pay | Admitting: Family Medicine

## 2014-11-04 DIAGNOSIS — R928 Other abnormal and inconclusive findings on diagnostic imaging of breast: Secondary | ICD-10-CM

## 2020-07-08 DEATH — deceased
